# Patient Record
Sex: Male | Born: 1990 | Race: Black or African American | Hispanic: No | Marital: Single | Smoking: Former smoker
Health system: Southern US, Community
[De-identification: ages and names within clinical notes are randomized; demographics above are authoritative.]

---
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
## 2012-10-26 ENCOUNTER — Emergency Department (HOSPITAL_COMMUNITY): Payer: Self-pay

## 2012-10-26 ENCOUNTER — Emergency Department (HOSPITAL_COMMUNITY)
Admission: EM | Admit: 2012-10-26 | Discharge: 2012-10-26 | Disposition: A | Discharge status: Home / Self Care | Payer: Self-pay | Attending: Emergency Medicine | Admitting: Emergency Medicine

## 2012-10-26 ENCOUNTER — Encounter (HOSPITAL_COMMUNITY): Payer: Self-pay | Admitting: *Deleted

## 2012-10-26 DIAGNOSIS — Y939 Activity, unspecified: Secondary | ICD-10-CM | POA: Insufficient documentation

## 2012-10-26 DIAGNOSIS — T07XXXA Unspecified multiple injuries, initial encounter: Secondary | ICD-10-CM

## 2012-10-26 DIAGNOSIS — Z87891 Personal history of nicotine dependence: Secondary | ICD-10-CM | POA: Insufficient documentation

## 2012-10-26 DIAGNOSIS — IMO0002 Reserved for concepts with insufficient information to code with codable children: Secondary | ICD-10-CM | POA: Insufficient documentation

## 2012-10-26 DIAGNOSIS — Y929 Unspecified place or not applicable: Secondary | ICD-10-CM | POA: Insufficient documentation

## 2012-10-26 DIAGNOSIS — I998 Other disorder of circulatory system: Secondary | ICD-10-CM | POA: Insufficient documentation

## 2012-10-26 DIAGNOSIS — S6990XA Unspecified injury of unspecified wrist, hand and finger(s), initial encounter: Secondary | ICD-10-CM | POA: Insufficient documentation

## 2012-10-26 MED ORDER — HYDROCODONE-ACETAMINOPHEN 5-325 MG PO TABS
2.0000 | ORAL_TABLET | Freq: Once | ORAL | Status: AC
  Administered 2012-10-26: 2 via ORAL
  Filled 2012-10-26: qty 2

## 2012-10-26 MED ORDER — HYDROCODONE-ACETAMINOPHEN 5-325 MG PO TABS: 2.0000 | ORAL_TABLET | ORAL | Status: DC | PRN

## 2012-10-26 NOTE — ED Provider Notes (Signed)
Medical screening examination/treatment/procedure(s) were performed by non-physician practitioner and as supervising physician I was immediately available for consultation/collaboration.   Flint Melter, MD 10/26/12 1730

## 2012-10-26 NOTE — ED Provider Notes (Signed)
History     CSN: [id number]  Arrival date & time 10/26/12  4098   First MD Initiated Contact with Patient 10/26/12 [phone]      Chief Complaint  Patient presents with  . Hand Injury    (Consider location/radiation/quality/duration/timing/severity/associated sxs/prior treatment) The history is provided by the patient and medical records.    Matthew Salazar is a 22 y.o. male  with no medical Hx presents to the Emergency Department complaining of acute, persistent, progressively worsening pain in the R hand onset 11:30pm.  Pt states he punches several things including walls, but did not punch anyone in the mouth.  Patient states he cleaned his hand with warm water, soap and peroxide. Associated symptoms include abrasions, swelling, ecchymosis, pain.  Nothing makes it better and nothing makes it worse.  Pt denies , chills, headache, neck pain, back pain, chest pain, abdominal pain, shoulder pain, elbow pain, wrist pain for weakness, numbness, tingling, loss of function.     History reviewed. No pertinent past medical history.  History reviewed. No pertinent past surgical history.  No family history on file.  History  Substance Use Topics  . Smoking status: Former Games developer  . Smokeless tobacco: Not on file  . Alcohol Use: Yes      Review of Systems  Musculoskeletal: Positive for joint swelling and arthralgias.  Skin: Positive for wound.  Neurological: Negative for numbness.  All other systems reviewed and are negative.    Allergies  Peanut-containing drug products  Home Medications  No current outpatient prescriptions on file.  BP 138/92  Pulse 66  Temp 98.4 F (36.9 C) (Oral)  Resp 18  SpO2 99%  Physical Exam  Nursing note and vitals reviewed. Constitutional: He appears well-developed and well-nourished. No distress.  HENT:  Head: Normocephalic and atraumatic.  Eyes: Conjunctivae normal are normal.  Neck: Normal range of motion and full passive range of motion  without pain. No spinous process tenderness and no muscular tenderness present.  Cardiovascular: Normal rate, regular rhythm, normal heart sounds and intact distal pulses.   Pulses:      Radial pulses are 2+ on the right side, and 2+ on the left side.       Capillary refill < 3 sec  Pulmonary/Chest: Effort normal and breath sounds normal. He has no decreased breath sounds. He has no wheezes. He has no rhonchi.  Musculoskeletal: He exhibits tenderness. He exhibits no edema.       Right hand: He exhibits tenderness, laceration and swelling. He exhibits normal range of motion, normal two-point discrimination and normal capillary refill. normal sensation noted. Normal strength noted.       Hands:      ROM: Full Range of motion of the right fingers, wrist and elbow; moderate pain with range of motion and palpation of the right hand  Multiple abrasions on the dorsal aspect of the hand  Neurological: He is alert. Coordination normal. GCS eye subscore is 4. GCS verbal subscore is 5. GCS motor subscore is 6.  Reflex Scores:      Tricep reflexes are 2+ on the left side.      Bicep reflexes are 2+ on the right side and 2+ on the left side.      Brachioradialis reflexes are 2+ on the right side and 2+ on the left side.      Sensation to dull and sharp, 2 point discrimination intact Strength 5/5 of the right hand, fingers, wrist and elbow; equal grip strength bilaterally  Skin: Skin is warm and dry. Abrasion (right hand), bruising and ecchymosis noted. No laceration noted. He is not diaphoretic.    ED Course  Procedures (including critical care time)  Labs Reviewed - No data to display Dg Hand Complete Right  10/26/2012  *RADIOLOGY REPORT*  Clinical Data: Right hand injury.  The patient hit something.  RIGHT HAND - COMPLETE 3+ VIEW  Comparison: None.  Findings: There is no evidence for acute fracture or dislocation. No soft tissue foreign body or gas identified.  IMPRESSION: Negative exam.   Original  Report Authenticated By: Norva Pavlov, M.D.      1. Hand injury   2. Abrasions of multiple sites       MDM  Roxy Horseman Rauber assistant injury to the right hand.  Wound cleaned and dressed.  X-ray without evidence of acute fracture or dislocation; no foreign body identified in the  Soft tissue.  Will discharge home with symptomatic therapy and instructions to f/u with his PCP if symptoms persist.  I do not believe the abrasions require an atibiotic at this time as there was no contact with the human mouth and there are no signs of cellulitis at this time.  have also discussed reasons to return immediately to the ER.  Patient expresses understanding and agrees with plan.    1. Medications: vicodin, usual home medications 2. Treatment: rest, drink plenty of fluids, keep wound clean with warm soap and water, keep bandages dry 3. Follow Up: Please followup with your primary doctor for discussion of your diagnoses and further evaluation after today's visit;        Dahlia Client Tymara Saur, PA-C 10/26/12 1006

## 2012-10-26 NOTE — ED Notes (Signed)
Pt is here with right hand injury after hitting wall last pm.  Abrasions to top of hand and moving all digits

## 2013-07-27 IMAGING — CR DG HAND COMPLETE 3+V*R*
3 series · 3 of 3 positions shown · non-contrast
Comparison: None.

CLINICAL DATA: Right hand injury.  The patient hit something.

RIGHT HAND - COMPLETE 3+ VIEW

[x hand pa right]
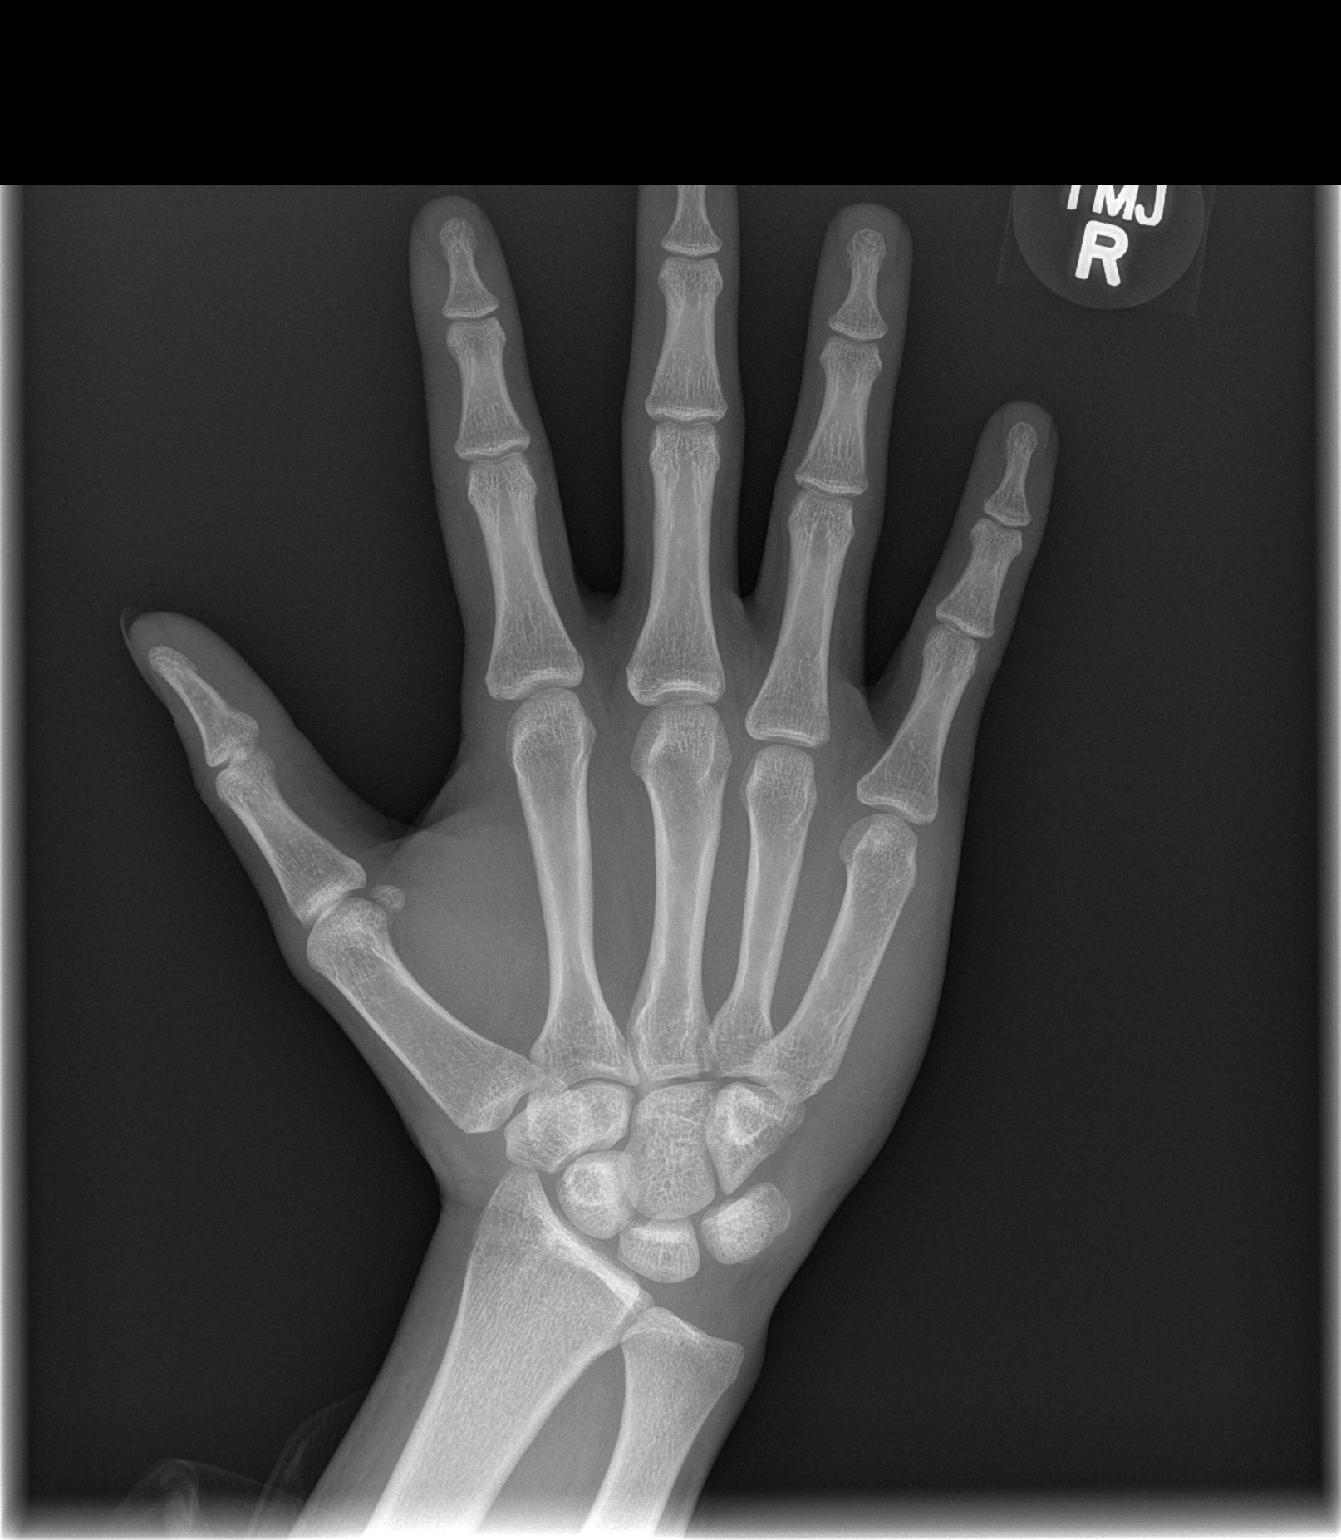

[x hand oblique right *]
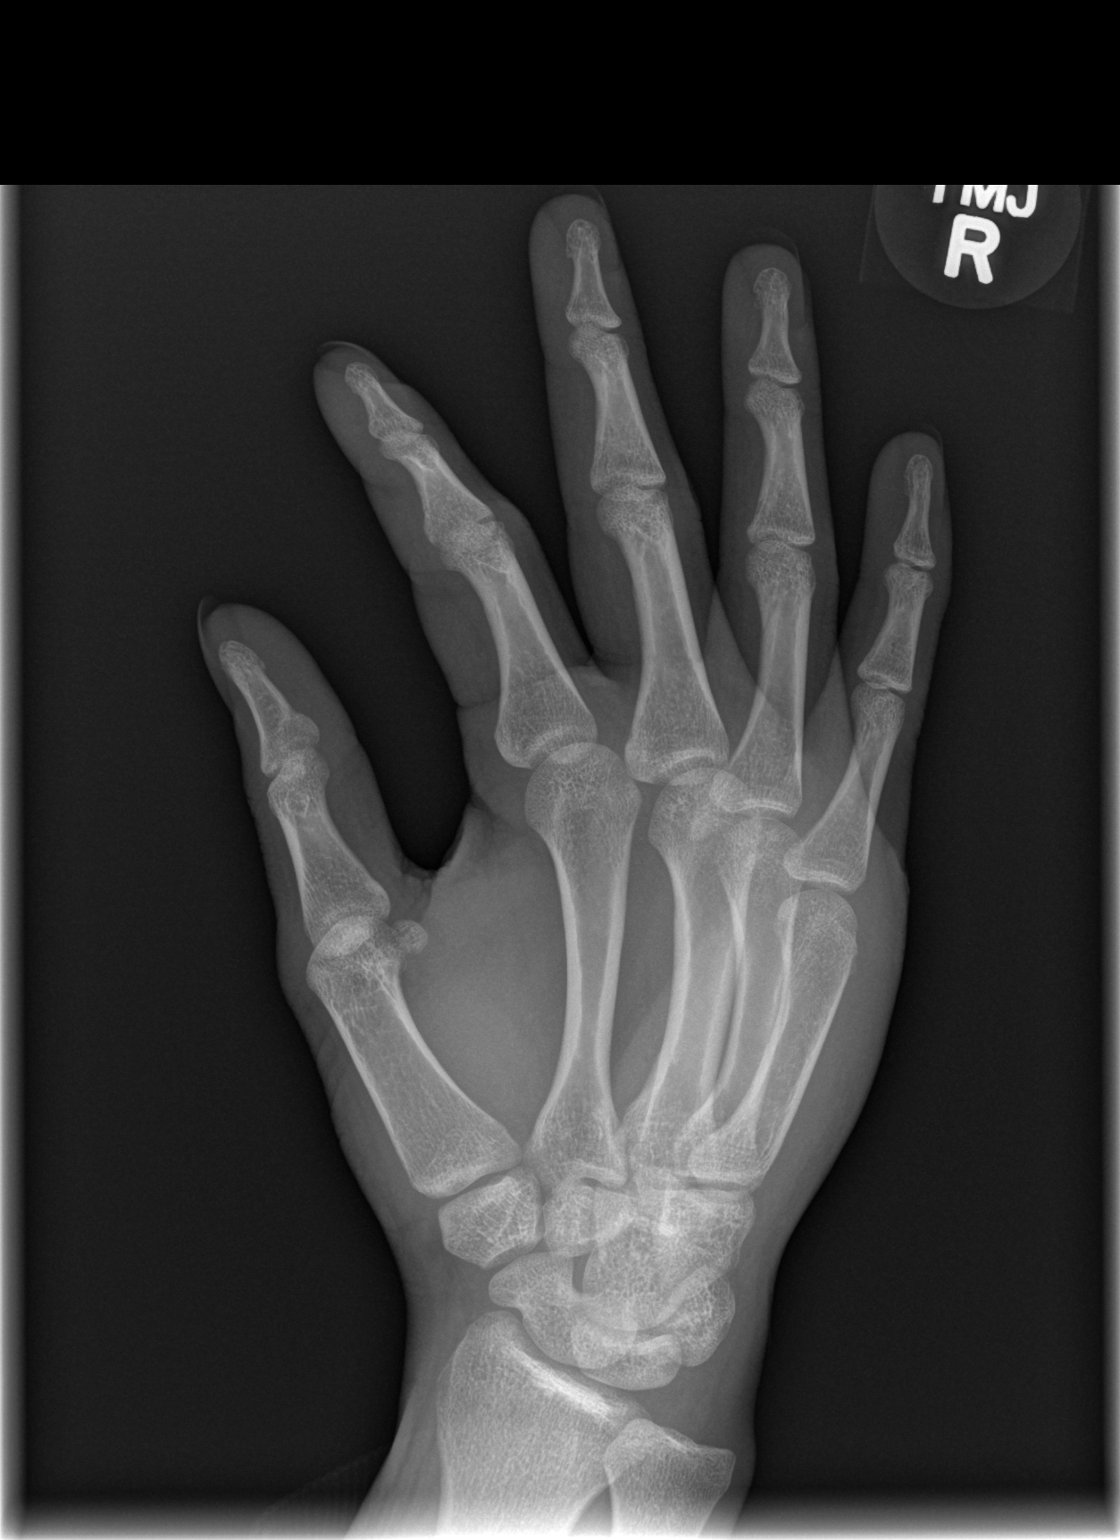

[x hand lat right *]
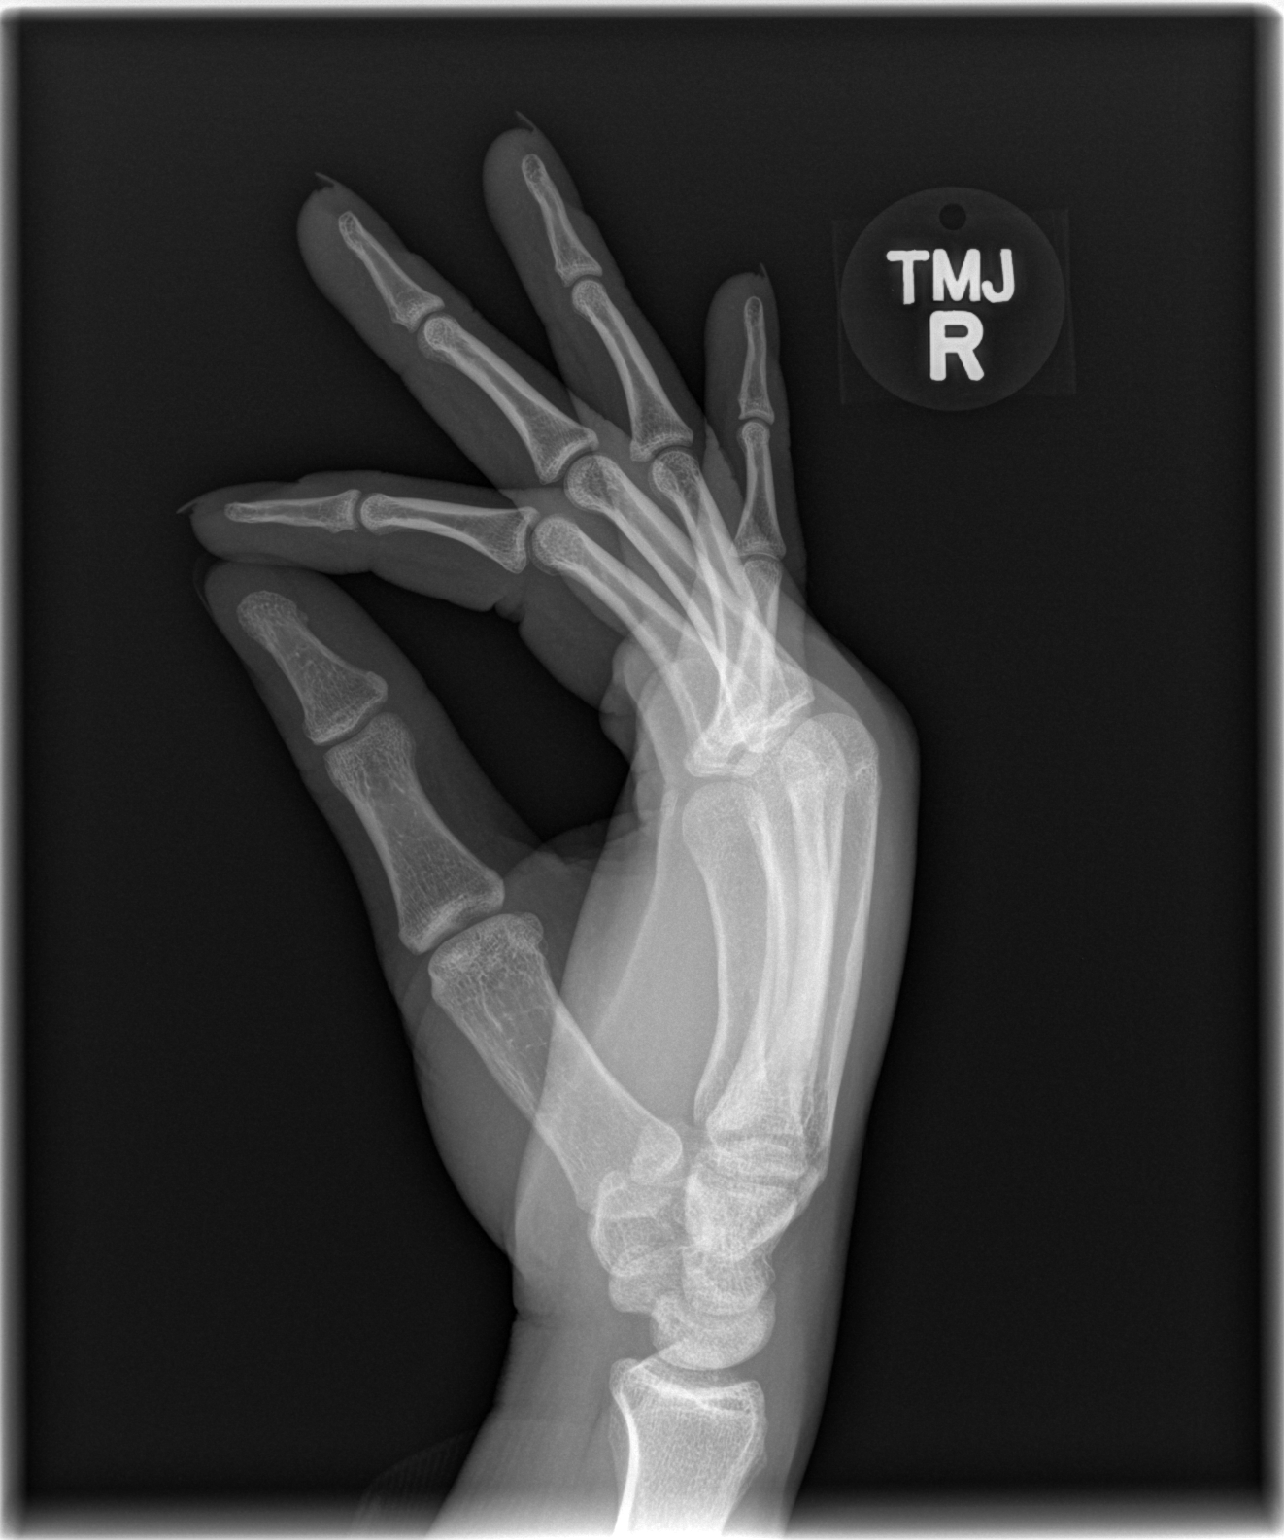

[3 of 3 positions shown; findings below may reference images not displayed]

FINDINGS: There is no evidence for acute fracture or dislocation.
No soft tissue foreign body or gas identified.
IMPRESSION: Negative exam.

## 2013-11-15 ENCOUNTER — Encounter (HOSPITAL_COMMUNITY): Payer: Self-pay | Admitting: Emergency Medicine

## 2013-11-15 ENCOUNTER — Emergency Department (HOSPITAL_COMMUNITY)
Admission: EM | Admit: 2013-11-15 | Discharge: 2013-11-15 | Disposition: A | Discharge status: Home / Self Care | Payer: Self-pay | Attending: Emergency Medicine | Admitting: Emergency Medicine

## 2013-11-15 DIAGNOSIS — Z87891 Personal history of nicotine dependence: Secondary | ICD-10-CM | POA: Insufficient documentation

## 2013-11-15 DIAGNOSIS — Z202 Contact with and (suspected) exposure to infections with a predominantly sexual mode of transmission: Secondary | ICD-10-CM | POA: Insufficient documentation

## 2013-11-15 DIAGNOSIS — Z711 Person with feared health complaint in whom no diagnosis is made: Secondary | ICD-10-CM

## 2013-11-15 LAB — GC/CHLAMYDIA PROBE AMP
CT PROBE, AMP APTIMA: NEGATIVE
GC Probe RNA: NEGATIVE

## 2013-11-15 LAB — RPR: RPR: NONREACTIVE

## 2013-11-15 LAB — HIV ANTIBODY (ROUTINE TESTING W REFLEX): HIV: NONREACTIVE

## 2013-11-15 NOTE — ED Notes (Signed)
Pt (denies sx; denies: pain, nvd, fever, penile d/c, hematuria, dysuria, urinary sx, rash, back pain, testicle or scrotal pain, dizziness or other sx), alert, NAD, calm, interactive.

## 2013-11-15 NOTE — Discharge Instructions (Signed)
Herpes Simplex Herpes simplex is generally classified as Type 1 or Type 2. Type 1 is generally the type that is responsible for cold sores. Type 2 is generally associated with sexually transmitted diseases. We now know that most of the thoughts on these viruses are inaccurate. We find that HSV1 is also present genitally and HSV2 can be present orally, but this will vary in different locations of the world. Herpes simplex is usually detected by doing a culture. Blood tests are also available for this virus; however, the accuracy is often not as good.  PREPARATION FOR TEST No preparation or fasting is necessary. NORMAL FINDINGS  No virus present  No HSV antigens or antibodies present Ranges for normal findings may vary among different laboratories and hospitals. You should always check with your doctor after having lab work or other tests done to discuss the meaning of your test results and whether your values are considered within normal limits. MEANING OF TEST  Your caregiver will go over the test results with you and discuss the importance and meaning of your results, as well as treatment options and the need for additional tests if necessary. OBTAINING THE TEST RESULTS  It is your responsibility to obtain your test results. Ask the lab or department performing the test when and how you will get your results. Document Released: 11/14/2004 Document Revised: 01/04/2012 Document Reviewed: 09/22/2008 [facility] Patient Information 2014 [place], [place].  Sexually Transmitted Disease A sexually transmitted disease (STD) is a disease or infection that may be passed (transmitted) from person to person, usually during sexual activity. This may happen by way of saliva, semen, blood, vaginal mucus, or urine. Common STDs include:   Gonorrhea.   Chlamydia.   Syphilis.   HIV and AIDS.   Genital herpes.   Hepatitis B and C.   Trichomonas.   Human papillomavirus (HPV).   Pubic lice.    Scabies.  Mites.  Bacterial vaginosis. WHAT ARE CAUSES OF STDs? An STD may be caused by bacteria, a virus, or parasites. STDs are often transmitted during sexual activity if one person is infected. However, they may also be transmitted through nonsexual means. STDs may be transmitted after:   Sexual intercourse with an infected person.   Sharing sex toys with an infected person.   Sharing needles with an infected person or using unclean piercing or tattoo needles.  Having intimate contact with the genitals, mouth, or rectal areas of an infected person.   Exposure to infected fluids during birth. WHAT ARE THE SIGNS AND SYMPTOMS OF STDs? Different STDs have different symptoms. Some people may not have any symptoms. If symptoms are present, they may include:   Painful or bloody urination.   Pain in the pelvis, abdomen, vagina, anus, throat, or eyes.   Skin rash, itching, irritation, growths, sores (lesions), ulcerations, or warts in the genital or anal area.  Abnormal vaginal discharge with or without bad odor.   Penile discharge in men.   Fever.   Pain or bleeding during sexual intercourse.   Swollen glands in the groin area.   Yellow skin and eyes (jaundice). This is seen with hepatitis.   Swollen testicles.  Infertility.  Sores and blisters in the mouth. HOW ARE STDs DIAGNOSED? To make a diagnosis, your health care provider may:   Take a medical history.   Perform a physical exam.   Take a sample of any discharge for examination.  Swab the throat, cervix, opening to the penis, rectum, or vagina for testing.  Test a sample of your first morning urine.   Perform blood tests.   Perform a Pap smear, if this applies.   Perform a colposcopy.   Perform a laparoscopy.  HOW ARE STDs TREATED? Treatment depends on the STD. Some STDs may be treated but not cured.   Chlamydia, gonorrhea, trichomonas, and syphilis can be cured with  antibiotics.   Genital herpes, hepatitis, and HIV can be treated, but not cured, with prescribed medicines. The medicines lessen symptoms.   Genital warts from HPV can be treated with medicine or by freezing, burning (electrocautery), or surgery. Warts may come back.   HPV cannot be cured with medicine or surgery. However, abnormal areas may be removed from the cervix, vagina, or vulva.   If your diagnosis is confirmed, your recent sexual partners need treatment. This is true even if they are symptom-free or have a negative culture or evaluation. They should not have sex until their health care providers say it is OK. HOW CAN I REDUCE MY RISK OF GETTING AN STD?  Use latex condoms, dental dams, and water-soluble lubricants during sexual activity. Do not use petroleum jelly or oils.  Get vaccinated for HPV and hepatitis. If you have not received these vaccines in the past, talk to your health care provider about whether one or both might be right for you.   Avoid risky sex practices that can break the skin.  WHAT SHOULD I DO IF I THINK I HAVE AN STD?  See your health care provider.   Inform all sexual partners. They should be tested and treated for any STDs.  Do not have sex until your health care provider says it is OK. WHEN SHOULD I GET HELP? Seek immediate medical care if:  You develop severe abdominal pain.  You are a man and notice swelling or pain in the testicles.  You are a woman and notice swelling or pain in your vagina. Document Released: 01/02/2003 Document Revised: 08/02/2013 Document Reviewed: 05/02/2013 [facility] Patient Information 2014 [place].

## 2013-11-15 NOTE — ED Notes (Signed)
Pt. requesting STD check , pt. stated he kissed somebody with herpes . Denies pain or discomfort.

## 2013-11-15 NOTE — ED Notes (Signed)
Lab at [facility] 

## 2013-11-15 NOTE — ED Provider Notes (Signed)
CSN: [id number]     Arrival date & time 11/15/13  0307 History   First MD Initiated Contact with Patient 11/15/13 [phone]     Chief Complaint  Patient presents with  . Exposure to STD   (Consider location/radiation/quality/duration/timing/severity/associated sxs/prior Treatment) HPI Comments: Patient is a 23 year old male with no significant past medical history presents to the emergency department requesting an STD check. Patient states that he kissed a girl that may have had herpes. He states that this is not confirmed and only a rumor; however, he is still concerned. Patient denies any oral lesions or sores as well as any sore throat or difficulty swallowing. Patient versus a history of unprotected sexual intercourse, but denies abdominal pain, vomiting, fever, dysuria or hematuria, penile swelling or discharge, penile redness, scrotal redness or swelling, and numbness/tingling.  Patient is a 23 y.o. male presenting with STD exposure. The history is provided by the patient. No language interpreter was used.  Exposure to STD Pertinent negatives include no abdominal pain, fever, numbness, sore throat, vomiting or weakness.    History reviewed. No pertinent past medical history. History reviewed. No pertinent past surgical history. No family history on file. History  Substance Use Topics  . Smoking status: Former Games developermoker  . Smokeless tobacco: Not on file  . Alcohol Use: Yes    Review of Systems  Constitutional: Negative for fever.  HENT: Negative for sore throat and trouble swallowing.        No oral lesions or sores  Gastrointestinal: Negative for vomiting and abdominal pain.  Genitourinary: Negative for dysuria, discharge, penile swelling, scrotal swelling, penile pain and testicular pain.  Neurological: Negative for weakness and numbness.  All other systems reviewed and are negative.    Allergies  Peanut-containing drug products  Home Medications   Current Outpatient Rx  Name   Route  Sig  Dispense  Refill  . ibuprofen (ADVIL,MOTRIN) 200 MG tablet   Oral   Take 600-800 mg by mouth every 6 (six) hours as needed for headache.          BP 143/84  Pulse 58  Temp(Src) 97.5 F (36.4 C) (Oral)  Resp 18  SpO2 99%  Physical Exam  Nursing note and vitals reviewed. Constitutional: He is oriented to person, place, and time. He appears well-developed and well-nourished. No distress.  HENT:  Head: Normocephalic and atraumatic.  Eyes: Conjunctivae and EOM are normal. No scleral icterus.  Neck: Normal range of motion.  Pulmonary/Chest: Effort normal. No respiratory distress.  Abdominal: Soft. He exhibits no distension. There is no tenderness. Hernia confirmed negative in the right inguinal area and confirmed negative in the left inguinal area.  Genitourinary: Testes normal and penis normal. Right testis shows no mass, no swelling and no tenderness. Right testis is descended. Left testis shows no mass, no swelling and no tenderness. Left testis is descended. Circumcised. No penile erythema or penile tenderness. No discharge found.  Chaperoned GU exam unremarkable  Musculoskeletal: Normal range of motion.  Neurological: He is alert and oriented to person, place, and time.  Skin: Skin is warm and dry. No rash noted. He is not diaphoretic. No erythema. No pallor.  Psychiatric: He has a normal mood and affect. His behavior is normal.    ED Course  Procedures (including critical care time) Labs Review Labs Reviewed  GC/CHLAMYDIA PROBE AMP  RPR  HIV ANTIBODY (ROUTINE TESTING)   Imaging Review No results found.  EKG Interpretation   None  MDM   1. Concern about STD in male without diagnosis    23 year old male presents for STD check after kissing a girl who may have had herpes. I discussed with the patient that there is no way to test for herpes in the emergency department, but that he shows no signs consistent with HSV 1 or HSV-2. Patient with no oral  lesions or sores appreciated. He is tolerating secretions without difficulty.   GU exam today is unremarkable. Have also discussed whether patient would like to be tested for other STDs including GC/Chlamydia, syphilis, and HIV. Patient desired testing in the ED which is currently pending. Given patient's lack of symptoms and reassuring abdominal examination, do not believe he warrants treatment with azithromycin and Rocephin today., However, advised patient to refrain from sexual intercourse until his partners are tested and treated for STDs and he is able to followup on the results of his cultures. Return precautions provided and patient agreeable to plan with no unaddressed concerns.   Filed Vitals:   11/15/13 0318  BP: 143/84  Pulse: 58  Temp: 97.5 F (36.4 C)  TempSrc: Oral  Resp: 18  SpO2: 99%      Antony Madura, PA-C 11/15/13 [phone]

## 2013-11-17 NOTE — ED Provider Notes (Signed)
Medical screening examination/treatment/procedure(s) were performed by non-physician practitioner and as supervising physician I was immediately available for consultation/collaboration.  EKG Interpretation   None        1. Concern about STD in male without diagnosis       Candyce ChurnJohn David Teighan Aubert, MD 11/17/13 [phone]

## 2013-11-21 ENCOUNTER — Telehealth (HOSPITAL_COMMUNITY): Payer: Self-pay

## 2014-11-10 ENCOUNTER — Emergency Department (HOSPITAL_COMMUNITY)
Admission: EM | Admit: 2014-11-10 | Discharge: 2014-11-10 | Disposition: A | Discharge status: Home / Self Care | Payer: Self-pay | Attending: Emergency Medicine | Admitting: Emergency Medicine

## 2014-11-10 ENCOUNTER — Encounter (HOSPITAL_COMMUNITY): Payer: Self-pay | Admitting: Emergency Medicine

## 2014-11-10 DIAGNOSIS — R0981 Nasal congestion: Secondary | ICD-10-CM | POA: Insufficient documentation

## 2014-11-10 DIAGNOSIS — Z87891 Personal history of nicotine dependence: Secondary | ICD-10-CM | POA: Insufficient documentation

## 2014-11-10 DIAGNOSIS — J3489 Other specified disorders of nose and nasal sinuses: Secondary | ICD-10-CM | POA: Insufficient documentation

## 2014-11-10 DIAGNOSIS — J029 Acute pharyngitis, unspecified: Secondary | ICD-10-CM | POA: Insufficient documentation

## 2014-11-10 LAB — RAPID STREP SCREEN (MED CTR MEBANE ONLY): STREPTOCOCCUS, GROUP A SCREEN (DIRECT): NEGATIVE

## 2014-11-10 MED ORDER — DM-GUAIFENESIN ER 30-600 MG PO TB12: 1.0000 | ORAL_TABLET | Freq: Two times a day (BID) | ORAL | Status: AC

## 2014-11-10 NOTE — Discharge Instructions (Signed)
Upper Respiratory Infection, Adult An upper respiratory infection (URI) is also sometimes known as the common cold. The upper respiratory tract includes the nose, sinuses, throat, trachea, and bronchi. Bronchi are the airways leading to the lungs. Most people improve within 1 week, but symptoms can last up to 2 weeks. A residual cough may last even longer.  CAUSES Many different viruses can infect the tissues lining the upper respiratory tract. The tissues become irritated and inflamed and often become very moist. Mucus production is also common. A cold is contagious. You can easily spread the virus to others by oral contact. This includes kissing, sharing a glass, coughing, or sneezing. Touching your mouth or nose and then touching a surface, which is then touched by another person, can also spread the virus. SYMPTOMS  Symptoms typically develop 1 to 3 days after you come in contact with a cold virus. Symptoms vary from person to person. They may include:  Runny nose.  Sneezing.  Nasal congestion.  Sinus irritation.  Sore throat.  Loss of voice (laryngitis).  Cough.  Fatigue.  Muscle aches.  Loss of appetite.  Headache.  Low-grade fever. DIAGNOSIS  You might diagnose your own cold based on familiar symptoms, since most people get a cold 2 to 3 times a year. Your caregiver can confirm this based on your exam. Most importantly, your caregiver can check that your symptoms are not due to another disease such as strep throat, sinusitis, pneumonia, asthma, or epiglottitis. Blood tests, throat tests, and X-rays are not necessary to diagnose a common cold, but they may sometimes be helpful in excluding other more serious diseases. Your caregiver will decide if any further tests are required. RISKS AND COMPLICATIONS  You may be at risk for a more severe case of the common cold if you smoke cigarettes, have chronic heart disease (such as heart failure) or lung disease (such as asthma), or if  you have a weakened immune system. The very young and very old are also at risk for more serious infections. Bacterial sinusitis, middle ear infections, and bacterial pneumonia can complicate the common cold. The common cold can worsen asthma and chronic obstructive pulmonary disease (COPD). Sometimes, these complications can require emergency medical care and may be life-threatening. PREVENTION  The best way to protect against getting a cold is to practice good hygiene. Avoid oral or hand contact with people with cold symptoms. Wash your hands often if contact occurs. There is no clear evidence that vitamin C, vitamin E, echinacea, or exercise reduces the chance of developing a cold. However, it is always recommended to get plenty of rest and practice good nutrition. TREATMENT  Treatment is directed at relieving symptoms. There is no cure. Antibiotics are not effective, because the infection is caused by a virus, not by bacteria. Treatment may include:  Increased fluid intake. Sports drinks offer valuable electrolytes, sugars, and fluids.  Breathing heated mist or steam (vaporizer or shower).  Eating chicken soup or other clear broths, and maintaining good nutrition.  Getting plenty of rest.  Using gargles or lozenges for comfort.  Controlling fevers with ibuprofen or acetaminophen as directed by your caregiver.  Increasing usage of your inhaler if you have asthma. Zinc gel and zinc lozenges, taken in the first 24 hours of the common cold, can shorten the duration and lessen the severity of symptoms. Pain medicines may help with fever, muscle aches, and throat pain. A variety of non-prescription medicines are available to treat congestion and runny nose. Your caregiver   can make recommendations and may suggest nasal or lung inhalers for other symptoms.  HOME CARE INSTRUCTIONS   Only take over-the-counter or prescription medicines for pain, discomfort, or fever as directed by your  caregiver.  Use a warm mist humidifier or inhale steam from a shower to increase air moisture. This may keep secretions moist and make it easier to breathe.  Drink enough water and fluids to keep your urine clear or pale yellow.  Rest as needed.  Return to work when your temperature has returned to normal or as your caregiver advises. You may need to stay home longer to avoid infecting others. You can also use a face mask and careful hand washing to prevent spread of the virus. SEEK MEDICAL CARE IF:   After the first few days, you feel you are getting worse rather than better.  You need your caregiver's advice about medicines to control symptoms.  You develop chills, worsening shortness of breath, or brown or red sputum. These may be signs of pneumonia.  You develop yellow or brown nasal discharge or pain in the face, especially when you bend forward. These may be signs of sinusitis.  You develop a fever, swollen neck glands, pain with swallowing, or white areas in the back of your throat. These may be signs of strep throat. SEEK IMMEDIATE MEDICAL CARE IF:   You have a fever.  You develop severe or persistent headache, ear pain, sinus pain, or chest pain.  You develop wheezing, a prolonged cough, cough up blood, or have a change in your usual mucus (if you have chronic lung disease).  You develop sore muscles or a stiff neck. Document Released: 04/07/2001 Document Revised: 01/04/2012 Document Reviewed: 01/17/2014 [employer] Patient Information 2015 [employer]. This information is not intended to replace advice given to you by your health care provider. Make sure you discuss any questions you have with your health care provider.  

## 2014-11-10 NOTE — ED Notes (Signed)
Pt from home c/o sore throat and congestion x 1 week

## 2014-11-10 NOTE — ED Provider Notes (Signed)
CSN: [id number]     Arrival date & time 11/10/14  7829 History   First MD Initiated Contact with Patient 11/10/14 1013     Chief Complaint  Patient presents with  . Sore Throat  . Nasal Congestion     (Consider location/radiation/quality/duration/timing/severity/associated sxs/prior Treatment) Patient is a 24 y.o. male presenting with pharyngitis. The history is provided by the patient.  Sore Throat This is a new problem. The current episode started 1 to 4 weeks ago. The problem occurs constantly. The problem has been unchanged. Associated symptoms include congestion. Pertinent negatives include no abdominal pain, anorexia, fatigue, fever or nausea. The symptoms are aggravated by drinking and eating.   He reports having sore throat mainly due to nasal congestion going down the back of his throat. He has had no fevers, nausea, vomiting, diarrhea, neck pain, cough, ear pain, wheezing, throat swelling, drooling or shortness of breath. He requests a work note because he missed work on Thursday and Friday, he is due to go back on Monday.   History reviewed. No pertinent past medical history. History reviewed. No pertinent past surgical history. No family history on file. History  Substance Use Topics  . Smoking status: Former Games developer  . Smokeless tobacco: Not on file  . Alcohol Use: Yes    Review of Systems  Constitutional: Negative for fever and fatigue.  HENT: Positive for congestion.   Gastrointestinal: Negative for nausea, abdominal pain and anorexia.      Allergies  Peanut-containing drug products  Home Medications   Prior to Admission medications   Medication Sig Start Date End Date Taking? Authorizing Provider  ibuprofen (ADVIL,MOTRIN) 200 MG tablet Take 600-800 mg by mouth every 6 (six) hours as needed for headache.   Yes Historical Provider, MD  dextromethorphan-guaiFENesin (MUCINEX DM) 30-600 MG per 12 hr tablet Take 1 tablet by mouth 2 (two) times daily. 11/10/14    Peyson Postema Irine Seal, PA-C   BP 133/80 mmHg  Pulse 85  Temp(Src) 97.8 F (36.6 C) (Oral)  Resp 16  SpO2 98% Physical Exam  Constitutional: He is oriented to person, place, and time. He appears well-developed and well-nourished. No distress.  HENT:  Head: Normocephalic and atraumatic.  Right Ear: Tympanic membrane, external ear and ear canal normal.  Left Ear: Tympanic membrane, external ear and ear canal normal.  Nose: Rhinorrhea present. Right sinus exhibits no maxillary sinus tenderness and no frontal sinus tenderness. Left sinus exhibits no maxillary sinus tenderness and no frontal sinus tenderness.  Mouth/Throat: Uvula is midline and mucous membranes are normal. No trismus in the jaw. Normal dentition. No dental abscesses or uvula swelling. No oropharyngeal exudate, posterior oropharyngeal edema, posterior oropharyngeal erythema or tonsillar abscesses.  No submental edema, tongue not elevated, no trismus. No impending airway obstruction; Pt able to speak full sentences, swallow intact, no drooling, stridor, or tonsillar/uvula displacement. No palatal petechia  Eyes: Conjunctivae are normal.  Neck: Trachea normal, normal range of motion and full passive range of motion without pain. Neck supple. No rigidity. Normal range of motion present. No Brudzinski's sign noted.  Flexion and extension of neck without pain or difficulty. Able to breath without difficulty in extension.  Cardiovascular: Normal rate and regular rhythm.   Pulmonary/Chest: Effort normal and breath sounds normal. No stridor. No respiratory distress. He has no wheezes.  Abdominal: Soft. There is no tenderness.  No obvious evidence of splenomegaly. Non ttp.   Musculoskeletal: Normal range of motion.  Lymphadenopathy:       Head (right  side): No preauricular and no posterior auricular adenopathy present.       Head (left side): No preauricular and no posterior auricular adenopathy present.  Neurological: He is alert and  oriented to person, place, and time.  Skin: Skin is warm and dry. No rash noted. He is not diaphoretic.  Psychiatric: He has a normal mood and affect.  Nursing note and vitals reviewed.   ED Course  Procedures (including critical care time) Labs Review Labs Reviewed  RAPID STREP SCREEN    Imaging Review No results found.   EKG Interpretation None      MDM   Final diagnoses:  Nasal congestion   Non toxic, well appearing 24 yo male. The patient is requesting work note for his nasal congestion he has had that caused him to be absent. His nasal congestion is present. He has no red flag symptoms of sore throat/nasal congestion.  Rx:  dextromethorphan-guaiFENesin (MUCINEX DM) 30-600 MG   23 y.o.Clarke R Gaetz's evaluation in the Emergency Department is complete. It has been determined that no acute conditions requiring further emergency intervention are present at this time. The patient/guardian have been advised of the diagnosis and plan. We have discussed signs and symptoms that warrant return to the ED, such as changes or worsening in symptoms.  Vital signs are stable at discharge. Filed Vitals:   11/10/14 1004  BP: 133/80  Pulse: 85  Temp: 97.8 F (36.6 C)  Resp: 16    Patient/guardian has voiced understanding and agreed to follow-up with the PCP or specialist.     Dorthula Matasiffany G Nolin Grell, PA-C 11/10/14 1035  Linwood DibblesJon Knapp, MD 11/10/14 [phone]

## 2014-11-12 LAB — CULTURE, GROUP A STREP

## 2020-12-19 ENCOUNTER — Encounter: Payer: Self-pay | Admitting: Emergency Medicine

## 2020-12-19 ENCOUNTER — Other Ambulatory Visit: Payer: Self-pay

## 2020-12-19 ENCOUNTER — Ambulatory Visit
Admission: EM | Admit: 2020-12-19 | Discharge: 2020-12-19 | Disposition: A | Discharge status: Home / Self Care | Payer: Self-pay | Attending: Physician Assistant | Admitting: Physician Assistant

## 2020-12-19 DIAGNOSIS — J029 Acute pharyngitis, unspecified: Secondary | ICD-10-CM | POA: Insufficient documentation

## 2020-12-19 LAB — POCT RAPID STREP A (OFFICE): Rapid Strep A Screen: NEGATIVE

## 2020-12-19 MED ORDER — AMOXICILLIN 500 MG PO TABS: 500.0000 mg | ORAL_TABLET | Freq: Three times a day (TID) | ORAL | 0 refills | Status: AC

## 2020-12-19 NOTE — ED Triage Notes (Signed)
Sore throat on right side off and on for 4 months.  States it feels like throat is swollen today.

## 2020-12-19 NOTE — ED Provider Notes (Signed)
[facility]    CSN: [id number] Arrival date & time: 12/19/20  1027      History   Chief Complaint No chief complaint on file.   HPI Matthew Salazar is a 30 y.o. male.   Pt complains of throat pain on and off for 3 months.   The history is provided by the patient. No language interpreter was used.  Sore Throat This is a new problem. The current episode started 2 days ago. The problem occurs constantly. Associated symptoms include headaches. Pertinent negatives include no abdominal pain. Nothing aggravates the symptoms. Nothing relieves the symptoms. He has tried nothing for the symptoms.    No past medical history on file.  Patient Active Problem List   Diagnosis Date Noted  . SORE THROAT 04/16/2008  . ALLERGIC RHINITIS, CHRONIC 04/16/2008  . FRACTURE, FINGER 08/22/2007  . GROIN STRAIN 07/12/2007    No past surgical history on file.     Home Medications    Prior to Admission medications   Medication Sig Start Date End Date Taking? Authorizing Provider  amoxicillin (AMOXIL) 500 MG tablet Take 1 tablet (500 mg total) by mouth in the morning, at noon, and at bedtime. 12/19/20  Yes Elson Areas, PA-C  dextromethorphan-guaiFENesin [facility] DM) 30-600 MG per 12 hr tablet Take 1 tablet by mouth 2 (two) times daily. 11/10/14   Marlon Pel, PA-C  ibuprofen (ADVIL,MOTRIN) 200 MG tablet Take 600-800 mg by mouth every 6 (six) hours as needed for headache.    [provider]    Family History No family history on file.  Social History Social History   Tobacco Use  . Smoking status: Former Games developer  . Smokeless tobacco: Never Used  Substance Use Topics  . Alcohol use: Yes  . Drug use: No     Allergies   Peanut-containing drug products   Review of Systems Review of Systems  Gastrointestinal: Negative for abdominal pain.  Neurological: Positive for headaches.  All other systems reviewed and are negative.    Physical Exam Triage Vital  Signs ED Triage Vitals  Enc Vitals Group     BP 12/19/20 1039 133/89     Pulse Rate 12/19/20 1039 73     Resp 12/19/20 1039 18     Temp 12/19/20 1039 98.1 F (36.7 C)     Temp Source 12/19/20 1039 Oral     SpO2 12/19/20 1039 96 %     Weight --      Height --      Head Circumference --      Peak Flow --      Pain Score 12/19/20 1040 5     Pain Loc --      Pain Edu? --      Excl. in GC? --    No data found.  Updated Vital Signs BP 133/89 (BP Location: Right Arm)   Pulse 73   Temp 98.1 F (36.7 C) (Oral)   Resp 18   SpO2 96%   Visual Acuity Right Eye Distance:   Left Eye Distance:   Bilateral Distance:    Right Eye Near:   Left Eye Near:    Bilateral Near:     Physical Exam Vitals and nursing note reviewed.  Constitutional:      Appearance: He is well-developed and well-nourished.  HENT:     Head: Normocephalic and atraumatic.     Mouth/Throat:     Pharynx: Posterior oropharyngeal erythema present.  Eyes:  Conjunctiva/sclera: Conjunctivae normal.  Cardiovascular:     Rate and Rhythm: Normal rate and regular rhythm.     Heart sounds: No murmur heard.   Pulmonary:     Effort: Pulmonary effort is normal. No respiratory distress.     Breath sounds: Normal breath sounds.  Abdominal:     Palpations: Abdomen is soft.     Tenderness: There is no abdominal tenderness.  Musculoskeletal:        General: No edema.     Cervical back: Neck supple.  Skin:    General: Skin is warm and dry.  Neurological:     Mental Status: He is alert.  Psychiatric:        Mood and Affect: Mood and affect and mood normal.      UC Treatments / Results  Labs (all labs ordered are listed, but only abnormal results are displayed) Labs Reviewed  CULTURE, GROUP A STREP [facility])  POCT RAPID STREP A (OFFICE)    EKG   Radiology No results found.  Procedures Procedures (including critical care time)  Medications Ordered in UC Medications - No data to display  Initial  Impression / Assessment and Plan / UC Course  I have reviewed the triage vital signs and the nursing notes.  Pertinent labs & imaging results that were available during my care of the patient were reviewed by me and considered in my medical decision making (see chart for details).     MDM:  I will treat withantibiotics and refer to Dr. Suszanne Conners for chronic sore throats Final Clinical Impressions(s) / UC Diagnoses   Final diagnoses:  Acute pharyngitis, unspecified etiology   Discharge Instructions   None    ED Prescriptions    Medication Sig Dispense Auth. Provider   amoxicillin (AMOXIL) 500 MG tablet Take 1 tablet (500 mg total) by mouth in the morning, at noon, and at bedtime. 30 tablet Elson Areas, [place]     PDMP not reviewed this encounter.  An After Visit Summary was printed and given to the patient.    Elson Areas, [place] 12/19/20 1109

## 2020-12-22 LAB — CULTURE, GROUP A STREP (THRC)
